# Patient Record
Sex: Male | Born: 1965 | Race: White | Hispanic: No | Marital: Married | Smoking: Current every day smoker
Health system: Southern US, Community
[De-identification: ages and names within clinical notes are randomized; demographics above are authoritative.]

## PROBLEM LIST (undated history)

## (undated) DIAGNOSIS — G4730 Sleep apnea, unspecified: Secondary | ICD-10-CM

---

## 2003-11-10 ENCOUNTER — Other Ambulatory Visit: Payer: Self-pay

## 2005-01-31 ENCOUNTER — Inpatient Hospital Stay: Payer: Self-pay | Admitting: Unknown Physician Specialty

## 2005-03-28 ENCOUNTER — Ambulatory Visit: Payer: Self-pay

## 2005-08-14 ENCOUNTER — Other Ambulatory Visit: Payer: Self-pay

## 2005-08-15 ENCOUNTER — Observation Stay: Payer: Self-pay | Admitting: Internal Medicine

## 2005-09-14 ENCOUNTER — Emergency Department: Payer: Self-pay | Admitting: Unknown Physician Specialty

## 2005-11-04 ENCOUNTER — Ambulatory Visit: Payer: Self-pay | Admitting: Unknown Physician Specialty

## 2006-06-26 ENCOUNTER — Other Ambulatory Visit (HOSPITAL_COMMUNITY): Admission: RE | Admit: 2006-06-26 | Discharge: 2006-09-24 | Payer: Self-pay | Admitting: Psychiatry

## 2006-06-26 ENCOUNTER — Ambulatory Visit: Payer: Self-pay | Admitting: Psychiatry

## 2006-07-20 ENCOUNTER — Emergency Department: Payer: Self-pay | Admitting: Emergency Medicine

## 2006-09-18 ENCOUNTER — Observation Stay: Payer: Self-pay | Admitting: Internal Medicine

## 2006-09-19 ENCOUNTER — Other Ambulatory Visit: Payer: Self-pay

## 2006-09-19 ENCOUNTER — Inpatient Hospital Stay: Payer: Self-pay | Admitting: Psychiatry

## 2006-10-12 ENCOUNTER — Ambulatory Visit: Payer: Self-pay | Admitting: Unknown Physician Specialty

## 2006-12-23 ENCOUNTER — Emergency Department: Payer: Self-pay | Admitting: Emergency Medicine

## 2009-03-19 ENCOUNTER — Emergency Department: Payer: Self-pay | Admitting: Internal Medicine

## 2010-06-26 ENCOUNTER — Emergency Department: Payer: Self-pay | Admitting: Emergency Medicine

## 2011-12-16 ENCOUNTER — Inpatient Hospital Stay: Payer: Self-pay | Admitting: Psychiatry

## 2011-12-16 LAB — ETHANOL: Ethanol: 134 mg/dL

## 2011-12-16 LAB — COMPREHENSIVE METABOLIC PANEL
Albumin: 3.9 g/dL (ref 3.4–5.0)
Alkaline Phosphatase: 57 U/L (ref 50–136)
Calcium, Total: 8.8 mg/dL (ref 8.5–10.1)
Chloride: 109 mmol/L — ABNORMAL HIGH (ref 98–107)
Co2: 27 mmol/L (ref 21–32)
Creatinine: 0.75 mg/dL (ref 0.60–1.30)
EGFR (African American): >60
SGOT(AST): 24 U/L (ref 15–37)
SGPT (ALT): 21 U/L
Total Protein: 7.5 g/dL (ref 6.4–8.2)

## 2011-12-16 LAB — URINALYSIS, COMPLETE
Bilirubin,UR: NEGATIVE
Blood: NEGATIVE
Glucose,UR: NEGATIVE mg/dL (ref 0–75)
Glucose,UR: NEGATIVE mg/dL (ref 0–75)
Ketone: NEGATIVE
Ketone: NEGATIVE
Ph: 5 (ref 4.5–8.0)
Protein: NEGATIVE
Protein: NEGATIVE
RBC,UR: 1 /HPF (ref 0–5)
Specific Gravity: 1.017 (ref 1.003–1.030)
Specific Gravity: 1.017 (ref 1.003–1.030)
Squamous Epithelial: NONE SEEN
Squamous Epithelial: <1

## 2011-12-16 LAB — DRUG SCREEN, URINE
Barbiturates, Ur Screen: NEGATIVE (ref ?–200)
Benzodiazepine, Ur Scrn: POSITIVE (ref ?–200)
Cannabinoid 50 Ng, Ur ~~LOC~~: POSITIVE (ref ?–50)
Methadone, Ur Screen: NEGATIVE (ref ?–300)
Tricyclic, Ur Screen: NEGATIVE (ref ?–1000)

## 2011-12-16 LAB — CBC
HGB: 16.8 g/dL (ref 13.0–18.0)
MCH: 33.2 pg (ref 26.0–34.0)
MCHC: 34 g/dL (ref 32.0–36.0)
MCV: 98 fL (ref 80–100)
Platelet: 195 10*3/uL (ref 150–440)
RBC: 5.07 10*6/uL (ref 4.40–5.90)

## 2011-12-16 LAB — TSH: Thyroid Stimulating Horm: 0.995 u[IU]/mL

## 2011-12-17 LAB — BEHAVIORAL MEDICINE 1 PANEL
Albumin: 3.5 g/dL (ref 3.4–5.0)
Alkaline Phosphatase: 59 U/L (ref 50–136)
Anion Gap: 5 — ABNORMAL LOW (ref 7–16)
Basophil %: 0.6 %
Bilirubin,Total: 0.7 mg/dL (ref 0.2–1.0)
Calcium, Total: 8.9 mg/dL (ref 8.5–10.1)
Chloride: 106 mmol/L (ref 98–107)
Co2: 29 mmol/L (ref 21–32)
EGFR (Non-African Amer.): >60
Eosinophil %: 1.5 %
Glucose: 100 mg/dL — ABNORMAL HIGH (ref 65–99)
HGB: 16 g/dL (ref 13.0–18.0)
Lymphocyte #: 2.7 10*3/uL (ref 1.0–3.6)
Lymphocyte %: 31.5 %
MCH: 33.2 pg (ref 26.0–34.0)
MCHC: 34.1 g/dL (ref 32.0–36.0)
MCV: 97 fL (ref 80–100)
Monocyte #: 0.7 10*3/uL (ref 0.0–0.7)
Monocyte %: 8.1 %
Neutrophil #: 5 10*3/uL (ref 1.4–6.5)
Osmolality: 279 (ref 275–301)
Potassium: 4 mmol/L (ref 3.5–5.1)
Sodium: 140 mmol/L (ref 136–145)
Thyroid Stimulating Horm: 1.48 u[IU]/mL
Total Protein: 6.7 g/dL (ref 6.4–8.2)

## 2013-03-04 LAB — CBC WITH DIFFERENTIAL/PLATELET
Basophil #: 0.1 10*3/uL (ref 0.0–0.1)
Basophil %: 1.2 %
Eosinophil %: 0.7 %
Lymphocyte #: 2.6 10*3/uL (ref 1.0–3.6)
MCH: 35.5 pg — ABNORMAL HIGH (ref 26.0–34.0)
MCHC: 34 g/dL (ref 32.0–36.0)
Monocyte #: 0.4 x10 3/mm (ref 0.2–1.0)
Monocyte %: 4.9 %
Neutrophil #: 5.1 10*3/uL (ref 1.4–6.5)
Neutrophil %: 61.3 %
RBC: 4.96 10*6/uL (ref 4.40–5.90)
RDW: 14.8 % — ABNORMAL HIGH (ref 11.5–14.5)

## 2013-03-04 LAB — COMPREHENSIVE METABOLIC PANEL
Albumin: 3.6 g/dL (ref 3.4–5.0)
BUN: 5 mg/dL — ABNORMAL LOW (ref 7–18)
Bilirubin,Total: 0.6 mg/dL (ref 0.2–1.0)
Chloride: 105 mmol/L (ref 98–107)
Co2: 27 mmol/L (ref 21–32)
Creatinine: 0.63 mg/dL (ref 0.60–1.30)
EGFR (African American): >60
EGFR (Non-African Amer.): >60
Glucose: 82 mg/dL (ref 65–99)
Osmolality: 274 (ref 275–301)
Potassium: 3.7 mmol/L (ref 3.5–5.1)
SGPT (ALT): 131 U/L — ABNORMAL HIGH (ref 12–78)
Total Protein: 7.3 g/dL (ref 6.4–8.2)

## 2013-03-05 ENCOUNTER — Observation Stay: Payer: Self-pay | Admitting: Surgery

## 2013-04-25 ENCOUNTER — Emergency Department: Payer: Self-pay | Admitting: Emergency Medicine

## 2014-10-05 ENCOUNTER — Inpatient Hospital Stay (HOSPITAL_COMMUNITY)
Admission: EM | Admit: 2014-10-05 | Discharge: 2014-10-08 | DRG: 087 | Discharge status: Against Medical Advice | Payer: Medicare HMO | Attending: Neurosurgery | Admitting: Neurosurgery

## 2014-10-05 ENCOUNTER — Emergency Department (HOSPITAL_COMMUNITY): Payer: Medicare HMO

## 2014-10-05 ENCOUNTER — Encounter (HOSPITAL_COMMUNITY): Payer: Self-pay

## 2014-10-05 DIAGNOSIS — Z888 Allergy status to other drugs, medicaments and biological substances status: Secondary | ICD-10-CM | POA: Diagnosis not present

## 2014-10-05 DIAGNOSIS — G4730 Sleep apnea, unspecified: Secondary | ICD-10-CM | POA: Diagnosis present

## 2014-10-05 DIAGNOSIS — I609 Nontraumatic subarachnoid hemorrhage, unspecified: Secondary | ICD-10-CM

## 2014-10-05 DIAGNOSIS — Y929 Unspecified place or not applicable: Secondary | ICD-10-CM | POA: Diagnosis not present

## 2014-10-05 DIAGNOSIS — S02402A Zygomatic fracture, unspecified, initial encounter for closed fracture: Secondary | ICD-10-CM | POA: Diagnosis present

## 2014-10-05 DIAGNOSIS — F17210 Nicotine dependence, cigarettes, uncomplicated: Secondary | ICD-10-CM | POA: Diagnosis present

## 2014-10-05 DIAGNOSIS — S065XAA Traumatic subdural hemorrhage with loss of consciousness status unknown, initial encounter: Secondary | ICD-10-CM | POA: Diagnosis present

## 2014-10-05 DIAGNOSIS — S065X0A Traumatic subdural hemorrhage without loss of consciousness, initial encounter: Principal | ICD-10-CM | POA: Diagnosis present

## 2014-10-05 DIAGNOSIS — I6200 Nontraumatic subdural hemorrhage, unspecified: Secondary | ICD-10-CM | POA: Diagnosis present

## 2014-10-05 DIAGNOSIS — S59902A Unspecified injury of left elbow, initial encounter: Secondary | ICD-10-CM

## 2014-10-05 DIAGNOSIS — S0219XA Other fracture of base of skull, initial encounter for closed fracture: Secondary | ICD-10-CM | POA: Diagnosis present

## 2014-10-05 DIAGNOSIS — S065X9A Traumatic subdural hemorrhage with loss of consciousness of unspecified duration, initial encounter: Secondary | ICD-10-CM | POA: Diagnosis present

## 2014-10-05 HISTORY — DX: Sleep apnea, unspecified: G47.30

## 2014-10-05 LAB — PROTIME-INR
INR: 1.09 (ref 0.00–1.49)
PROTHROMBIN TIME: 14.2 s (ref 11.6–15.2)

## 2014-10-05 LAB — COMPREHENSIVE METABOLIC PANEL
ALBUMIN: 3.5 g/dL (ref 3.5–5.2)
ALK PHOS: 72 U/L (ref 39–117)
ALT: 52 U/L (ref 0–53)
ANION GAP: 14 (ref 5–15)
AST: 42 U/L — AB (ref 0–37)
BILIRUBIN TOTAL: 0.5 mg/dL (ref 0.3–1.2)
BUN: 9 mg/dL (ref 6–23)
CHLORIDE: 98 meq/L (ref 96–112)
CO2: 27 mEq/L (ref 19–32)
Calcium: 8.8 mg/dL (ref 8.4–10.5)
Creatinine, Ser: 0.74 mg/dL (ref 0.50–1.35)
GFR calc Af Amer: >90 mL/min (ref >90)
GFR calc non Af Amer: >90 mL/min (ref >90)
Glucose, Bld: 147 mg/dL — ABNORMAL HIGH (ref 70–99)
POTASSIUM: 3.8 meq/L (ref 3.7–5.3)
SODIUM: 139 meq/L (ref 137–147)
TOTAL PROTEIN: 6.6 g/dL (ref 6.0–8.3)

## 2014-10-05 LAB — CBC
HEMATOCRIT: 47.3 % (ref 39.0–52.0)
Hemoglobin: 16.1 g/dL (ref 13.0–17.0)
MCH: 34.2 pg — ABNORMAL HIGH (ref 26.0–34.0)
MCHC: 34 g/dL (ref 30.0–36.0)
MCV: 100.4 fL — ABNORMAL HIGH (ref 78.0–100.0)
Platelets: 174 10*3/uL (ref 150–400)
RBC: 4.71 MIL/uL (ref 4.22–5.81)
RDW: 13.2 % (ref 11.5–15.5)
WBC: 13.9 10*3/uL — ABNORMAL HIGH (ref 4.0–10.5)

## 2014-10-05 LAB — SAMPLE TO BLOOD BANK

## 2014-10-05 LAB — ETHANOL: Alcohol, Ethyl (B): 68 mg/dL — ABNORMAL HIGH (ref 0–11)

## 2014-10-05 LAB — CDS SEROLOGY

## 2014-10-05 LAB — MRSA PCR SCREENING: MRSA by PCR: NEGATIVE

## 2014-10-05 MED ORDER — CEPHALEXIN 250 MG PO CAPS
1000.0000 mg | ORAL_CAPSULE | Freq: Once | ORAL | Status: AC
  Administered 2014-10-05: 1000 mg via ORAL
  Filled 2014-10-05: qty 4

## 2014-10-05 MED ORDER — SODIUM CHLORIDE 0.9 % IV SOLN
INTRAVENOUS | Status: DC
  Administered 2014-10-07: 16:00:00 via INTRAVENOUS
  Filled 2014-10-05: qty 1000

## 2014-10-05 MED ORDER — ONDANSETRON HCL 4 MG/2ML IJ SOLN
4.0000 mg | Freq: Four times a day (QID) | INTRAMUSCULAR | Status: DC | PRN
  Administered 2014-10-06: 4 mg via INTRAVENOUS
  Filled 2014-10-05: qty 2

## 2014-10-05 MED ORDER — MORPHINE SULFATE 2 MG/ML IJ SOLN
2.0000 mg | INTRAMUSCULAR | Status: DC | PRN
  Administered 2014-10-05 (×3): 2 mg via INTRAVENOUS
  Administered 2014-10-06 – 2014-10-07 (×6): 4 mg via INTRAVENOUS
  Filled 2014-10-05 (×3): qty 2
  Filled 2014-10-05 (×2): qty 1
  Filled 2014-10-05 (×2): qty 2
  Filled 2014-10-05: qty 1
  Filled 2014-10-05 (×2): qty 2

## 2014-10-05 MED ORDER — LIDOCAINE-EPINEPHRINE (PF) 2 %-1:200000 IJ SOLN
INTRAMUSCULAR | Status: AC
  Administered 2014-10-05: 05:00:00
  Filled 2014-10-05: qty 20

## 2014-10-05 MED ORDER — ACETAMINOPHEN 325 MG PO TABS
650.0000 mg | ORAL_TABLET | ORAL | Status: DC | PRN
  Administered 2014-10-07: 650 mg via ORAL
  Filled 2014-10-05: qty 2

## 2014-10-05 MED ORDER — OXYCODONE-ACETAMINOPHEN 5-325 MG PO TABS
2.0000 | ORAL_TABLET | Freq: Once | ORAL | Status: AC
Start: 2014-10-05 — End: 2014-10-05
  Administered 2014-10-05: 2 via ORAL
  Filled 2014-10-05: qty 2

## 2014-10-05 MED ORDER — SODIUM CHLORIDE 0.9 % IV SOLN
INTRAVENOUS | Status: AC
  Administered 2014-10-05: 09:00:00 via INTRAVENOUS

## 2014-10-05 MED ORDER — ONDANSETRON HCL 4 MG PO TABS: 4.0000 mg | ORAL_TABLET | Freq: Four times a day (QID) | ORAL | Status: DC | PRN

## 2014-10-05 MED ORDER — LIDOCAINE-EPINEPHRINE (PF) 2 %-1:200000 IJ SOLN
INTRAMUSCULAR | Status: AC
  Administered 2014-10-05: 07:00:00
  Filled 2014-10-05: qty 20

## 2014-10-05 MED ORDER — HYDROCODONE-ACETAMINOPHEN 5-325 MG PO TABS
1.0000 | ORAL_TABLET | ORAL | Status: DC | PRN
  Administered 2014-10-07 – 2014-10-08 (×7): 1 via ORAL
  Filled 2014-10-05 (×7): qty 1

## 2014-10-05 MED ORDER — MORPHINE SULFATE 4 MG/ML IJ SOLN
4.0000 mg | Freq: Once | INTRAMUSCULAR | Status: AC
Start: 2014-10-05 — End: 2014-10-05
  Administered 2014-10-05: 4 mg via INTRAVENOUS
  Filled 2014-10-05: qty 1
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
## 2014-10-05 NOTE — ED Notes (Signed)
Misty StanleyLisa, pt's family contact number....[phone]

## 2014-10-05 NOTE — ED Notes (Signed)
The tech cleaned the laceration on the left lateral side.  The tech reported to the RN in charge.

## 2014-10-05 NOTE — ED Notes (Signed)
Belongings given to family at the bedside.
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
## 2014-10-05 NOTE — ED Notes (Signed)
Neurosurgeon at bedside to eval pt

## 2014-10-05 NOTE — ED Notes (Signed)
Neuro status declined since pts arrival, now somulent, Dr. Silverio LayYao notified.
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
## 2014-10-05 NOTE — ED Notes (Signed)
Dr. Silverio LayYao at bedside suturing.
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
## 2014-10-05 NOTE — ED Notes (Signed)
Suture cart at bedside 

## 2014-10-06 ENCOUNTER — Inpatient Hospital Stay (HOSPITAL_COMMUNITY): Payer: Medicare HMO

## 2014-10-06 NOTE — Plan of Care (Signed)
Problem: Phase II Progression Outcomes Goal: Progress activity as tolerated unless otherwise ordered Outcome: Completed/Met Date Met:  10/06/14     

## 2014-10-06 NOTE — Progress Notes (Signed)
UR completed.  Sencere Symonette, RN BSN [facility] CCM Trauma/Neuro ICU Case Manager [phone]  
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
## 2014-10-07 MED ORDER — OXYCODONE HCL 5 MG PO TABS: 20.0000 mg | ORAL_TABLET | ORAL | Status: DC | PRN

## 2014-10-07 NOTE — Progress Notes (Signed)
Patient ID: Tyler Murillo, male   DOB: 04/18/1966, 48 y.o.   MRN: [id number] Subjective:  The patient is alert and pleasant. He states he has been taking oxycodone 20 mg 6 times a day as prescribed by his primary doctor for chronic pain. He request this medication.  Objective: Vital signs in last 24 hours: Temp:  [97.8 F (36.6 C)-99.1 F (37.3 C)] 98.8 F (37.1 C) (11/24 0312) Pulse Rate:  [58-80] 77 (11/24 0312) Resp:  [15] 15 (11/23 1200) BP: (121-141)/(51-93) 139/93 mmHg (11/24 0312) SpO2:  [93 %-98 %] 97 % (11/24 0312) Weight:  [107.956 kg (238 lb)] 107.956 kg (238 lb) (11/24 0312)  Intake/Output from previous day: 11/23 0701 - 11/24 0700 In: 1350 [I.V.:1350] Out: 3100 [Urine:3100] Intake/Output this shift:    Physical exam the patient is alert and oriented 3. He is moving all 4 extremities well. His laceration is healing well.  Lab Results:  Recent Labs  10/05/14 0325  WBC 13.9*  HGB 16.1  HCT 47.3  PLT 174   BMET  Recent Labs  10/05/14 0325  NA 139  K 3.8  CL 98  CO2 27  GLUCOSE 147*  BUN 9  CREATININE 0.74  CALCIUM 8.8    Studies/Results: Ct Head Wo Contrast  10/06/2014   EXAM: CT HEAD WITHOUT CONTRAST  TECHNIQUE: Contiguous axial images were obtained from the base of the skull through the vertex without intravenous contrast.  COMPARISON:  Prior CT from 10/05/2014  FINDINGS: Previously identified left frontal calvarial fracture, left orbital fracture, and left zygomatic arch fracture again noted, stable from prior. Again, there is extension into the sphenoid sinuses bilaterally. Layering hemorrhage again seen within the sphenoid sinuses as well as the left maxillary sinus, slightly decreased from prior. Overlying left frontal scalp contusion is decreased in size. Skin staples now seen along the left frontoparietal scalp.  Small left subdural hemorrhage subjacent to the calvarial fracture is stable to slightly decreased in size measuring 4.3 mm in  maximal diameter. No significant mass effect. Right sided holo hemispheric subdural hemorrhage is also stable to slightly decreased in size measuring approximately 3.9 mm (previously 4.8 mm). Layering hemorrhage along the right tentorium again seen, slightly decreased.  Posttraumatic subarachnoid hemorrhage within the high left frontal lobe near the vertex is slightly decreased in conspicuity, likely related to redistribution. Small amount of subarachnoid hemorrhage present within the posterior left frontal lobe inferiorly, also stable.  No new intracranial hemorrhage or large vessel territory infarct. Parenchymal hypodensity within the peripheral right temporal lobe is more conspicuous as compared to prior study, likely nonhemorrhagic contusion  Trace right-to-left midline shift of approximately 2 mm is stable. No hydrocephalus. Basilar cisterns remain patent.  IMPRESSION: 1. Stable to slightly decreased size of bilateral subdural hemorrhages as compared to 10/05/2014. 2. Slight interval decrease in conspicuity of scattered subarachnoid hemorrhage within the left cerebral hemisphere, compatible with redistribution. 3. Persistent trace 2 mm of right-to-left midline shift, stable. No hydrocephalus. 4. Small scattered parenchymal hypodensities within the peripheral right temporal lobe, likely small nonhemorrhagic contusions. 5. No new intracranial process. 6. Stable skull fractures, left orbital fractures, and left zygomatic arch fracture.   Electronically Signed   By: Rise MuBenjamin  McClintock M.D.   On: 10/06/2014 01:23    Assessment/Plan: Hospital day #2: The patient is doing well. We will mobilize him today. I will likely discharge him tomorrow. We will start oxycodone.  LOS: 2 days     Future Yeldell D 10/07/2014, 8:07 AM

## 2014-10-07 NOTE — Progress Notes (Signed)
Attempt made to restart IV to right hand  but was unsuccessful.

## 2014-10-07 NOTE — Progress Notes (Signed)
Pt transferred from ICU to room 4N10.  Pt is alert & oriented. Pt is having head pain.  Pain medicine given to pt.  Will continue to monitor.  Estanislado EmmsAshley Schwarz, RN 10/07/2014 3:48 AM

## 2014-10-07 NOTE — Progress Notes (Signed)
Pt ambulated with standby assist x1 to the bathroom without assistive device needed.

## 2014-10-07 NOTE — Plan of Care (Signed)
Problem: Phase I Progression Outcomes Goal: OOB as tolerated unless otherwise ordered Outcome: Completed/Met Date Met:  10/07/14 Goal: Initial discharge plan identified Outcome: Completed/Met Date Met:  10/07/14 Goal: Voiding-avoid urinary catheter unless indicated Outcome: Completed/Met Date Met:  10/07/14 Goal: Hemodynamically stable Outcome: Completed/Met Date Met:  10/07/14  Problem: Phase II Progression Outcomes Goal: Tolerating diet Outcome: Completed/Met Date Met:  10/07/14

## 2014-10-07 NOTE — Progress Notes (Signed)
Pt asleep at this time and does not want visitors stating, " I am trying to rest." Pt sister called this morning with password Molly Maduro(Carles [phone]) stating that pt was too out of it to make up a password so one was given to him when he came to hospital. Pt sister also stated that she wanted to know how pt was doing and if pt wanted to see his children today because last night when the family came to visit pt kicked them out because it was too loud.  Pt confirmed that Tyler Murillo was his sister and gave permission to allow her to bring his after he rested.

## 2014-10-07 NOTE — Progress Notes (Signed)
Pt removed IV to right antecubital. Assessment done to site noted dry with no bleeding catheter in place. Pt denies pain or discomfort to site.  IV team consult order placed to restart another IV site.

## 2014-10-08 NOTE — Progress Notes (Signed)
No order for discharge acknowledged by this nurse. Discharge summary noted but discharge order reconciliation not complete unable to print. Charge nurse called x3 to OR to notify Md but he did not return call. Pt signed out AMA with family. Charge nurse alerted Chiropodistassistant director of unit.

## 2014-10-08 NOTE — Progress Notes (Signed)
Pt noted agitated this morning yelling at staff and pulled out IV to left forearm. IV site assessed catheter still in place, no bleeding.  Pt stated that if he couldn't have Xanax 2mg  he takes four times a day at home he was going to leave. MD called stated that pt will be discharged and was not going to give prescription for Xanax. Pt began to get more agitated. Noted sweating profusely cool wash cloth applied to his head. Manager was able to calm pt down as he agreed that he would wait until Md came to unit to discharge him. Pts sister called stating that she would be on her way be with him. Pt calm at this time and is getting bathed with assistance from his aide. He apologized for his behavior towards staff. Will continue to monitor.

## 2014-10-08 NOTE — Progress Notes (Signed)
Pt agitated and upset at this time noted walking the hallway.  Pt stated "Where is the doctor, I am ready to go home."  Staff was able to calm down as charge nurse called OR to notify Md that pt is ready for discharge.  Pt agreed to wait on the Md but stated, "I will not wait too long."  Pending discharge orders from Md.

## 2014-11-10 NOTE — Discharge Summary (Signed)
  Physician Discharge Summary  Patient ID: Tyler OmanRobert N Murillo MRN: [id number] DOB/AGE: 49/02/1966 49 y.o.  Admit date: 10/05/2014 Discharge date: 11/10/2014  Admission Diagnoses: Traumatic brain injury, subdural hematoma  Discharge Diagnoses: The same Active Problems:   Subdural hematoma   SDH (subdural hematoma)   Discharged Condition: good  Hospital Course: [facility] with a diagnosis of a traumatic subdural hematoma. He was observed in the ICU and then transferred to the floor. A follow-up head CT was stable. The patient signed out AMA on 10/08/2014.  Consults: None Significant Diagnostic Studies: Head CT Treatments: Observation Discharge Exam: Blood pressure 129/38, pulse 55, temperature 98.2 F (36.8 C), temperature source Oral, resp. rate 18, height 5\' 7"  (1.702 m), weight 107.956 kg (238 lb), SpO2 98 %. The patient was alert and oriented and moving all 4 extremities well.  Disposition: Home, the patient left AGAINST MEDICAL ADVICE     Medication List    ASK your doctor about these medications        alprazolam 2 MG tablet  Commonly known as:  XANAX  Take 2 mg by mouth every 6 (six) hours as needed for anxiety.     amphetamine-dextroamphetamine 30 MG tablet  Commonly known as:  ADDERALL  Take 30 mg by mouth daily.     Oxycodone HCl 20 MG Tabs  Take 1 tablet by mouth every 4 (four) hours as needed (for pain).         SignedCristi Loron: Tyler Murillo D 11/10/2014, 8:42 AM
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
## 2015-03-08 NOTE — Discharge Summary (Signed)
PATIENT NAME:  Tyler Murillo, Tyler Murillo MR#:  [id number] DATE OF BIRTH:  January 14, 1966  DATE OF ADMISSION:  12/16/2011 DATE OF DISCHARGE:  12/20/2011  HOSPITAL COURSE: See dictated history and physical for details of admission. This 49 year old man with a history of depression, irritability, substance abuse, and chronic pain was admitted to the hospital on petition because of escalating irritability, agitated behavior, and failure to take care of himself. It appeared that he was having worsening depressed mood and also had probably been overusing his medication. He was on a fairly high dose of Xanax and also had been abusing alcohol. His mother had tried to take him to his outpatient psychiatrist who had suggested a new treatment plan, but the patient continued to escalate and get agitated with his mother. In the hospital, he did not display any dangerous or aggressive behavior. He mostly stayed withdrawn and stayed in bed most of the time. When approached he would complain that he was having chronic back pain and that the bed was making it worse. He also complained that he refused to eat any food except that prepared by his mother and so was not eating any food in the hospital. He did comply with medication treatment and we made some adjustments by decreasing his Xanax to half the dose it was previously and simplifying his pain medicine back to the regimen that he had been on before. He was monitored for any alcohol withdrawal and did not have any serious physical problems as a result of it. He attended some groups but by and large stayed aloof from them. He seems to have chronic social anxiety as well as depression. We started him on Effexor 75 mg a day, which he tolerated well. At the time of discharge, he was calm, not showing any signs of psychosis, and denying any suicidal or homicidal ideation. He stated he was no longer angry at his mother. He was open to the suggestion that his mother continue to monitor his  medication and that he stop drinking entirely.   LABORATORY DATA: Admission labs showed thyroid stimulating hormone normal. Chemistry panel unremarkable. CBC normal. Drug screen was positive for opiates, cannabis, and benzodiazepines. Alcohol level was 134.   DISCHARGE MEDICATIONS:  1. Xanax 1 mg every 6 hours as needed for anxiety.  2. Lopid 600 mg twice a day.  3. Synthroid 75 mcg once a day.  4. Mobic 15 mg once a day.  5. Pravachol 40 mg at bedtime.  6. Effexor-XR 75 mg per day.  7. OxyContin extended-release 40 mg twice a day. 8. Oxycodone 5 mg every 12 hours p.r.n. for pain.  (Both of those only a 30 pill supply was given for them. He needs to get follow-up pain management from his regular pain doctor).   MENTAL STATUS EXAM AT DISCHARGE: Somewhat slovenly dressed and ill-groomed man. Cooperative with the exam. Eye contact normal. Psychomotor activity decreased. Speech decreased in total production. Normal in volume. Affect a little bit grumpy but reactive. Mood stated as being better. Thoughts are lucid and directed. No evidence of loosening of associations. Denies suicidal or homicidal ideation. Denies hallucinations. Shows improved insight and judgment.   DISPOSITION: He will follow up with Dr. Janeece Riggers and return home for outpatient treatment and also go back to his regular doctor for outpatient medication management.   DIAGNOSIS PRINCIPLE AND PRIMARY:   AXIS I: Mood disorder, not otherwise specified.   SECONDARY DIAGNOSES:   AXIS I:  1. Alcohol dependence. 2. Polysubstance dependence with  benzodiazepines and opiates, although he is still chronically on these medicines.   AXIS III: Chronic pain, elevated cholesterol, hypothyroid.   AXIS IV: Severe from very extreme social isolation.   AXIS V: Functioning at time of discharge 49. ____________________________ Audery AmelJohn T. Vondell Babers, MD jtc:slb D: 12/20/2011 14:02:05 ET     T: 12/20/2011 14:12:18 ET       JOB#: [id number] cc: Audery AmelJohn T.  Kambre Messner, MD, <Dictator> Audery AmelJOHN T Wenonah Milo MD ELECTRONICALLY SIGNED 12/21/2011 10:43
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
## 2015-05-26 IMAGING — CT CT CERVICAL SPINE W/O CM
1 series · 1 of 2 positions shown · non-contrast
Comparison: None.

CLINICAL DATA: Initial evaluation for acute trauma.

EXAM:
CT HEAD WITHOUT CONTRAST
CT CERVICAL SPINE WITHOUT CONTRAST
TECHNIQUE: Multidetector CT imaging of the head and cervical spine was
performed following the standard protocol without intravenous
contrast. Multiplanar CT image reconstructions of the cervical spine
were also generated.

[Series 100: scout · coronal · 0.6mm · 0.98mm/px · 1 of 2 slices shown]
[im 2/2]
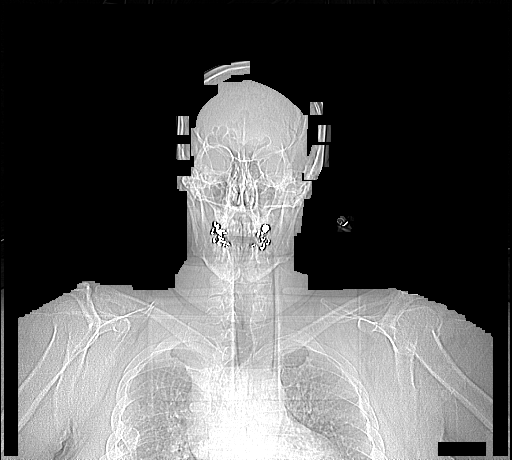

[1 of 2 positions shown; findings below may reference images not displayed]

FINDINGS: CT HEAD FINDINGS

There is an acute subdural hematoma overlying the right frontal lobe
measuring up to 5 mm in maximal diameter. Small volume subdural
hemorrhage present along the right tentorium as well. There is
scattered acute subarachnoid hemorrhage within the high left frontal
lobe near the vertex (series 201, image 28). There is a small left
extra-axial hemorrhage overlying the left cerebral convexity
measuring up to 5 mm in maximal thickness. Few scattered foci of
pneumocephalus present within this region (series 201, image 23).

There is trace 2 mm of right-to-left midline shift. No
hydrocephalus. Basilar cisterns remain patent.

No acute large vessel territory infarct.

Left frontal scalp contusion/ laceration is present. 5 mm linear
radiopaque density within the soft tissues of the left frontal scalp
likely reflects a retained foreign body (series 201, image 19).

There is a comminuted minimally displaced left frontal calvarial
fracture (series 202, image 41). Fracture fragment is minimally
depressed by approximately 2 mm. The fracture extends through the
left orbital apex and through the sphenoid sinuses (series 202,
image 18). Layering blood present within the sphenoid sinuses
bilaterally, left greater than right. The fracture does not appear
to involve the carotid canals. Small amount of layering hemorrhage
present within the left maxillary sinus as well. There is an acute
nondisplaced fracture through the left pterygoid plate. There is an
acute fracture of the left zygomatic arch without displacement.
Deformity at the left frontal sinus appears chronic in nature, and
may be related to chronic sinus disease or possibly prior surgical
intervention. The left frontal sinus is partially is opacified.

No acute abnormality seen about the orbits.

Sequelae of prior left mastoidectomy seen. No definite temporal bone
fracture. Scattered opacity present within the right mastoid air
cells as well.

CT CERVICAL SPINE FINDINGS

Study is somewhat limited by motion artifact.

The vertebral bodies are normally aligned with preservation of the
normal cervical lordosis. Vertebral body heights are preserved.
Normal C1-2 articulations are intact. No prevertebral soft tissue
swelling. No acute fracture or listhesis. Tiny osseous density at
the tip of the dens is favored to be chronic in nature.

Visualized soft tissues of the neck are within normal limits.
Visualized lung apices are clear without evidence of apical
pneumothorax.
IMPRESSION: CT BRAIN:

1. Acute right frontotemporal subdural hemorrhage measuring up to 5
mm in maximal diameter with extension along the right tentorium.
There is associated trace right-to-left midline shift of 2 mm. No
hydrocephalus. Basilar cisterns remain widely patent.
2. Small left extra-axial hemorrhage overlying the left frontal lobe
measuring up to 5 mm.
3. Acute traumatic subarachnoid hemorrhage within the high left
frontal lobe near the vertex.
4. Comminuted left frontal calvarial fracture with extension through
the left orbital apex and sphenoid sinuses with associated hemo
sinus as above.
5. Acute nondisplaced fracture of the left zygomatic arch.
6. Acute nondisplaced fracture of the left pterygoid plate. Further
evaluation with maxillofacial CT could be considered for complete
evaluation.
7. Sequelae of prior left mastoidectomy. No definite temporal bone
fracture.
8. Large left scalp contusion/laceration.

CT CERVICAL SPINE:

Somewhat limited study due to motion with no definite acute
traumatic injury within the cervical spine.

Critical Value/emergent results were called by telephone at the time
of interpretation on 10/05/2014 at [DATE] to Dr. LABERION BRANISELJ , who
verbally acknowledged these results.

## 2015-10-15 DEATH — deceased
